# Patient Record
Sex: Male | Born: 1935 | Race: White | Hispanic: No | Marital: Married
Health system: Southern US, Community
[De-identification: ages and names within clinical notes are randomized; demographics above are authoritative.]

---

## 2002-07-08 ENCOUNTER — Emergency Department (HOSPITAL_COMMUNITY): Admission: EM | Admit: 2002-07-08 | Discharge: 2002-07-08 | Payer: Self-pay | Admitting: Emergency Medicine

## 2002-07-08 ENCOUNTER — Encounter: Payer: Self-pay | Admitting: Emergency Medicine

## 2004-12-05 ENCOUNTER — Ambulatory Visit: Payer: Self-pay | Admitting: Family Medicine

## 2005-12-29 ENCOUNTER — Other Ambulatory Visit: Payer: Self-pay

## 2005-12-29 ENCOUNTER — Emergency Department: Payer: Self-pay | Admitting: Emergency Medicine

## 2006-01-02 ENCOUNTER — Ambulatory Visit: Payer: Self-pay | Admitting: Emergency Medicine

## 2006-03-19 ENCOUNTER — Emergency Department: Payer: Self-pay | Admitting: Unknown Physician Specialty

## 2006-03-23 ENCOUNTER — Other Ambulatory Visit: Payer: Self-pay

## 2006-03-23 ENCOUNTER — Emergency Department: Payer: Self-pay | Admitting: Emergency Medicine

## 2006-03-25 ENCOUNTER — Other Ambulatory Visit: Payer: Self-pay

## 2006-03-25 ENCOUNTER — Inpatient Hospital Stay: Payer: Self-pay | Admitting: Internal Medicine

## 2006-03-26 ENCOUNTER — Other Ambulatory Visit: Payer: Self-pay

## 2006-04-23 ENCOUNTER — Emergency Department: Payer: Self-pay | Admitting: Unknown Physician Specialty

## 2006-10-08 ENCOUNTER — Ambulatory Visit: Payer: Self-pay | Admitting: Gastroenterology

## 2008-09-17 ENCOUNTER — Inpatient Hospital Stay: Payer: Self-pay | Admitting: Internal Medicine

## 2009-09-11 ENCOUNTER — Ambulatory Visit: Payer: Self-pay | Admitting: Ophthalmology

## 2009-09-17 ENCOUNTER — Ambulatory Visit: Payer: Self-pay | Admitting: Ophthalmology

## 2010-05-13 ENCOUNTER — Ambulatory Visit: Payer: Self-pay | Admitting: Ophthalmology

## 2010-05-20 ENCOUNTER — Ambulatory Visit: Payer: Self-pay | Admitting: Ophthalmology

## 2010-07-09 ENCOUNTER — Emergency Department: Payer: Self-pay | Admitting: Emergency Medicine

## 2010-07-22 ENCOUNTER — Encounter: Payer: Self-pay | Admitting: Internal Medicine

## 2010-08-15 NOTE — Letter (Signed)
Summary: [facility] Face Sheet  [facility] Face Sheet   Imported By: Beau Fanny 07/24/2010 09:39:36  _____________________________________________________________________  External Attachment:    Type:   Image     Comment:   External Document

## 2011-02-18 DIAGNOSIS — G20 Parkinson's disease: Secondary | ICD-10-CM

## 2011-02-18 DIAGNOSIS — E785 Hyperlipidemia, unspecified: Secondary | ICD-10-CM

## 2011-02-18 DIAGNOSIS — F068 Other specified mental disorders due to known physiological condition: Secondary | ICD-10-CM

## 2011-02-18 DIAGNOSIS — I951 Orthostatic hypotension: Secondary | ICD-10-CM

## 2011-03-20 DIAGNOSIS — F068 Other specified mental disorders due to known physiological condition: Secondary | ICD-10-CM

## 2011-03-20 DIAGNOSIS — I2510 Atherosclerotic heart disease of native coronary artery without angina pectoris: Secondary | ICD-10-CM

## 2011-03-20 DIAGNOSIS — G20 Parkinson's disease: Secondary | ICD-10-CM

## 2011-04-23 ENCOUNTER — Ambulatory Visit: Payer: Self-pay | Admitting: Internal Medicine

## 2011-06-19 ENCOUNTER — Ambulatory Visit: Payer: Self-pay | Admitting: Surgery

## 2011-06-26 ENCOUNTER — Ambulatory Visit: Payer: Self-pay | Admitting: Surgery

## 2011-11-07 ENCOUNTER — Emergency Department: Payer: Self-pay | Admitting: Emergency Medicine

## 2011-11-07 LAB — COMPREHENSIVE METABOLIC PANEL
Albumin: 3.3 g/dL — ABNORMAL LOW (ref 3.4–5.0)
Alkaline Phosphatase: 57 U/L (ref 50–136)
Anion Gap: 6 — ABNORMAL LOW (ref 7–16)
BUN: 18 mg/dL (ref 7–18)
Bilirubin,Total: 0.7 mg/dL (ref 0.2–1.0)
Calcium, Total: 8.8 mg/dL (ref 8.5–10.1)
Chloride: 104 mmol/L (ref 98–107)
Co2: 31 mmol/L (ref 21–32)
Creatinine: 1.04 mg/dL (ref 0.60–1.30)
EGFR (African American): >60
EGFR (Non-African Amer.): >60
Glucose: 69 mg/dL (ref 65–99)
Osmolality: 282 (ref 275–301)
Potassium: 4.7 mmol/L (ref 3.5–5.1)
SGOT(AST): 21 U/L (ref 15–37)
SGPT (ALT): 7 U/L — ABNORMAL LOW
Sodium: 141 mmol/L (ref 136–145)
Total Protein: 7 g/dL (ref 6.4–8.2)

## 2011-11-07 LAB — CBC
HCT: 37.8 % — ABNORMAL LOW (ref 40.0–52.0)
HGB: 12.5 g/dL — ABNORMAL LOW (ref 13.0–18.0)
MCH: 29.2 pg (ref 26.0–34.0)
MCHC: 33 g/dL (ref 32.0–36.0)
MCV: 88 fL (ref 80–100)
Platelet: 201 10*3/uL (ref 150–440)
RBC: 4.27 10*6/uL — ABNORMAL LOW (ref 4.40–5.90)
RDW: 13.9 % (ref 11.5–14.5)
WBC: 7.8 10*3/uL (ref 3.8–10.6)

## 2011-11-07 LAB — TROPONIN I: Troponin-I: <0.02 ng/mL

## 2011-11-11 DIAGNOSIS — T148XXA Other injury of unspecified body region, initial encounter: Secondary | ICD-10-CM

## 2011-11-11 DIAGNOSIS — F068 Other specified mental disorders due to known physiological condition: Secondary | ICD-10-CM

## 2011-11-11 DIAGNOSIS — F329 Major depressive disorder, single episode, unspecified: Secondary | ICD-10-CM

## 2011-11-11 DIAGNOSIS — G20 Parkinson's disease: Secondary | ICD-10-CM

## 2011-11-17 ENCOUNTER — Telehealth: Payer: Self-pay | Admitting: Internal Medicine

## 2011-11-17 NOTE — Telephone Encounter (Signed)
To: [employer] (After Hours Triage) Fax: [phone] From: [facility] Date/ Time: 2011-12-10 7:48 PM Taken By: Forbes Cellar, CSR Caller: Tammy [facility]: [facility] Patient: Roberto Washington, Roberto Washington DOB: [birth year] Phone: [phone] Reason for Call: Babette Relic is calling from [facility] regarding the death of Josian Lanese. Patient of Karie Schwalbe.. The patient expired on 12-10-2011 at 19:15. Regarding Appointment: Appt Date: Appt Time: Unknown Provider: Reason: Details: Outcome:

## 2011-11-17 NOTE — Telephone Encounter (Signed)
Reviewed with wife today

## 2011-12-13 DEATH — deceased

## 2012-06-23 IMAGING — CT CT HEAD WITHOUT CONTRAST
3 series · 18 of 30 positions shown, 20 images · non-contrast
Comparison: none

REASON FOR EXAM: trauma
COMMENTS:

[Series 2: soft tissue · axial · 0.45mm/px · z∈[-224,-110]mm · 8 of 31 slices shown, 10 images]
[im 4/31  brain]
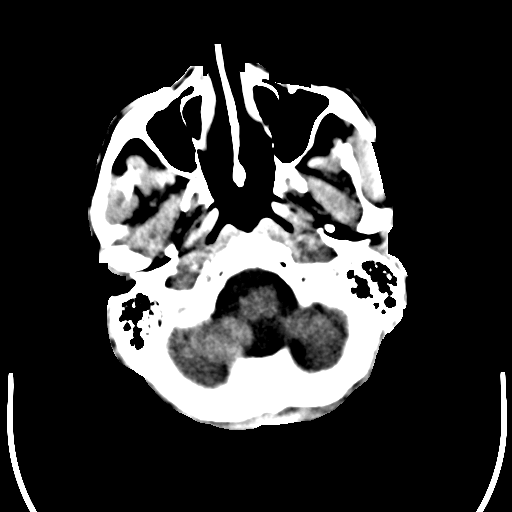
[im 4/31  bone]
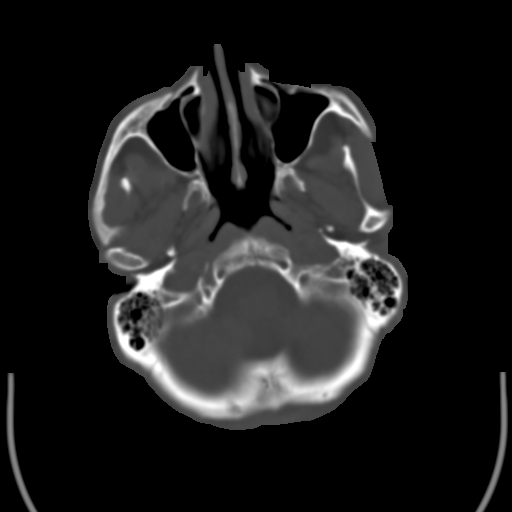
[im 7/31  brain]
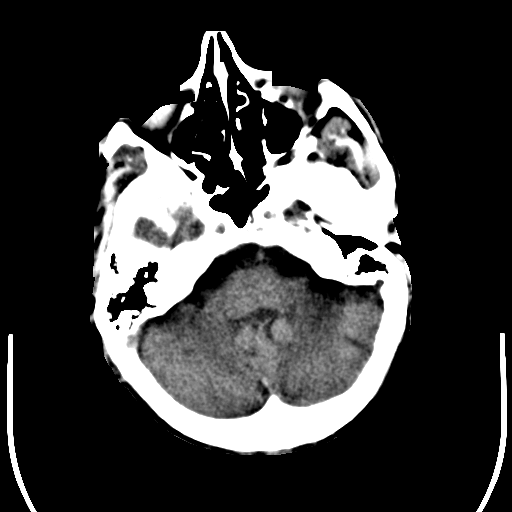
[im 11/31  brain]
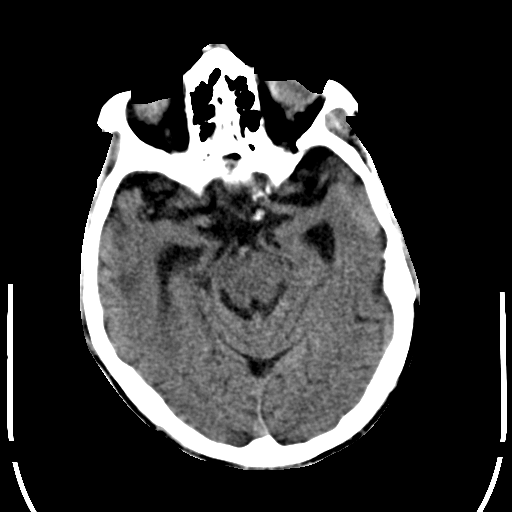
[im 14/31  brain]
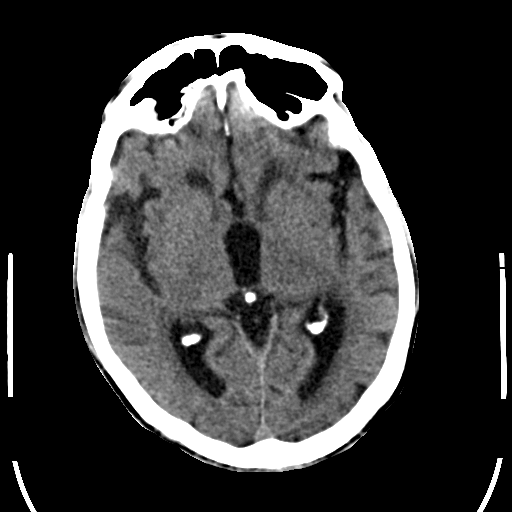
[im 17/31  brain]
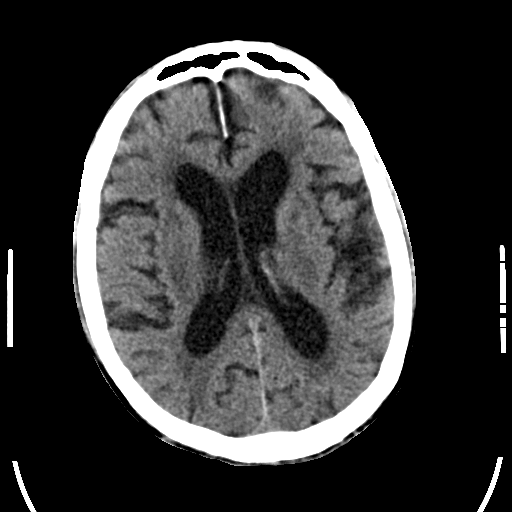
[im 17/31  bone]
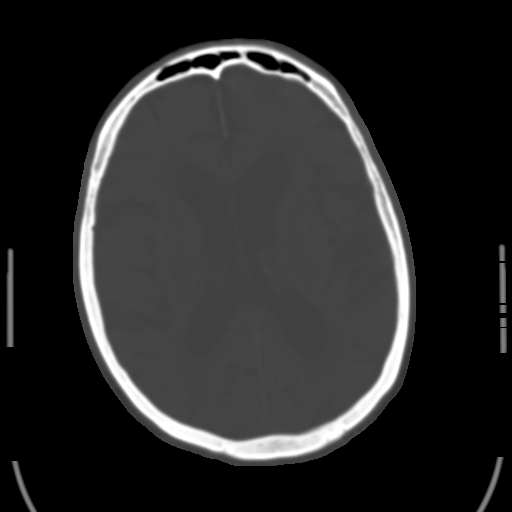
[im 21/31  brain]
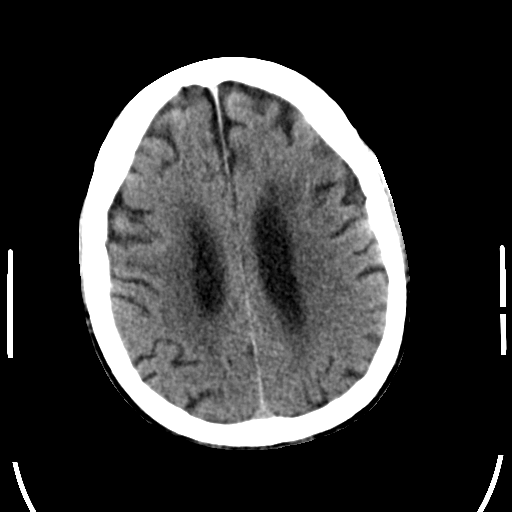
[im 24/31  brain]
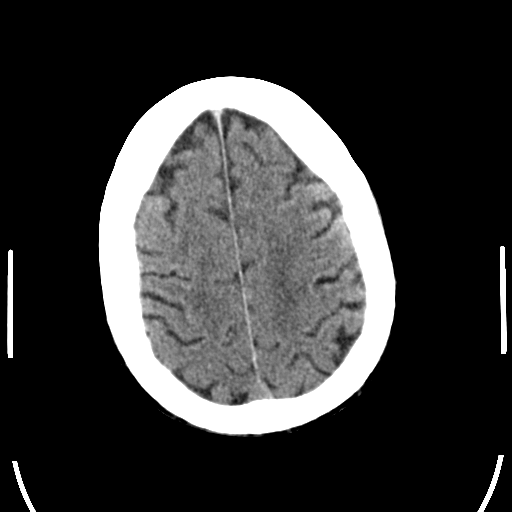
[im 27/31  brain]
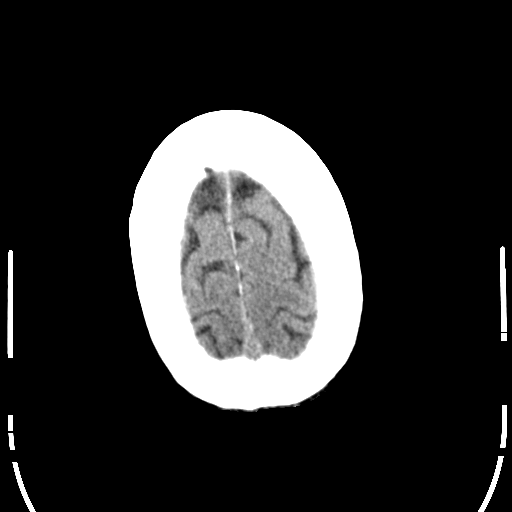

[Series 4: soft (id) · axial · 0.45mm/px · z∈[-191,-76]mm · 8 of 32 slices shown]
[im 4/32  brain]
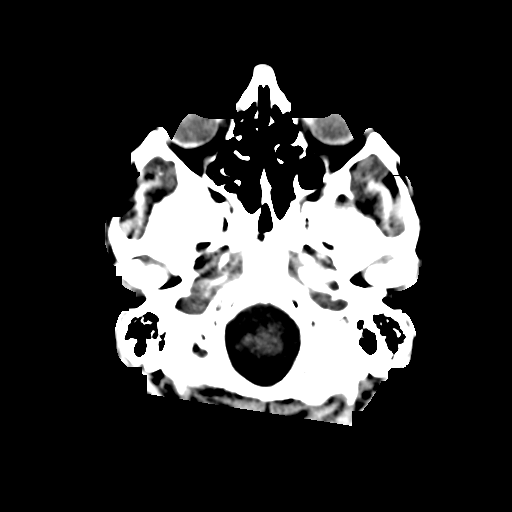
[im 7/32  brain]
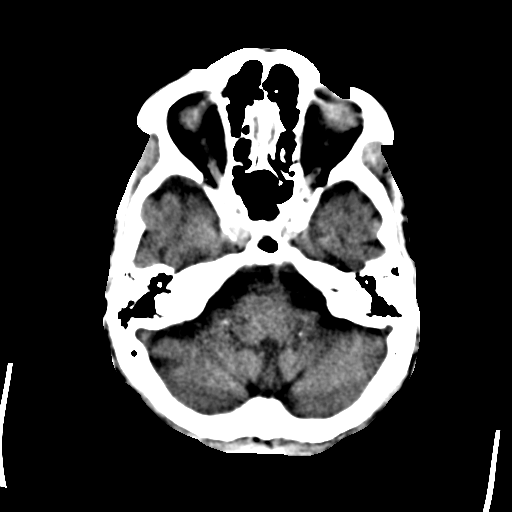
[im 11/32  brain]
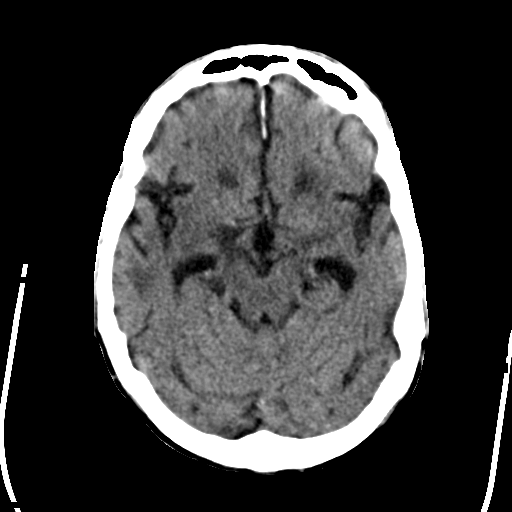
[im 14/32  brain]
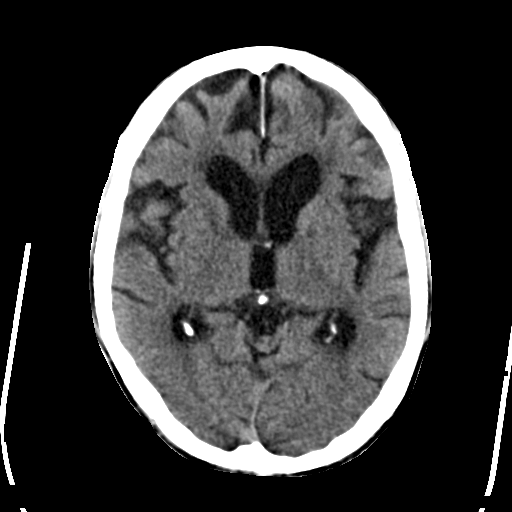
[im 18/32  brain]
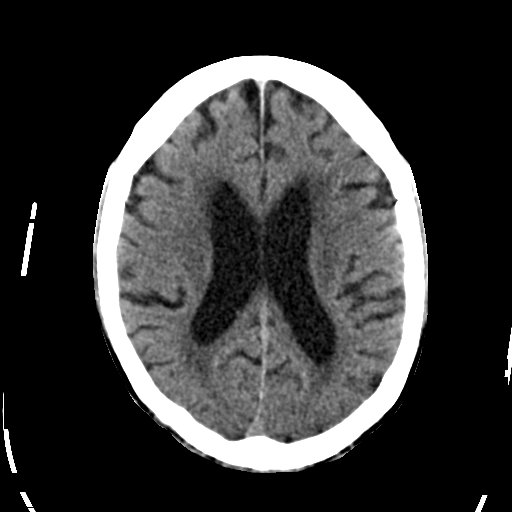
[im 21/32  brain]
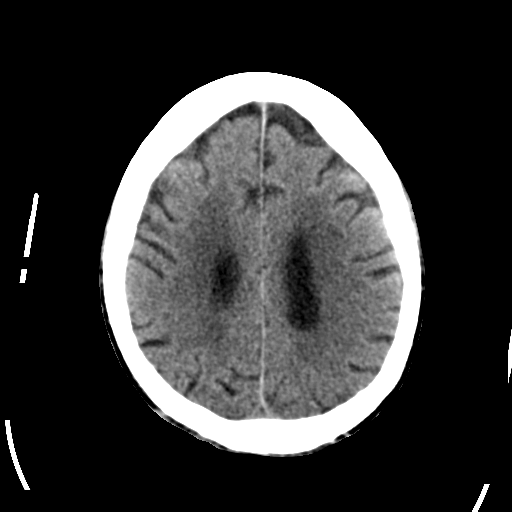
[im 25/32  brain]
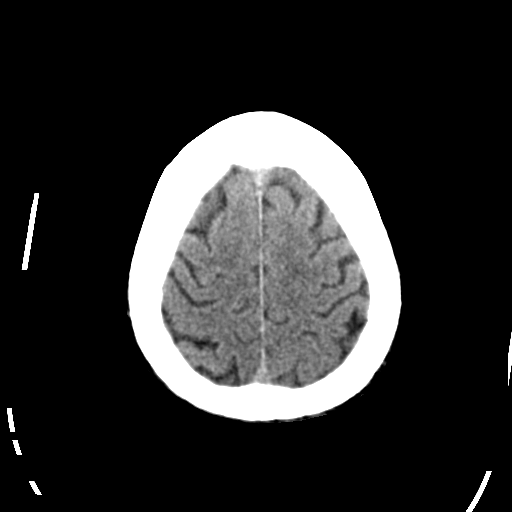
[im 28/32  brain]
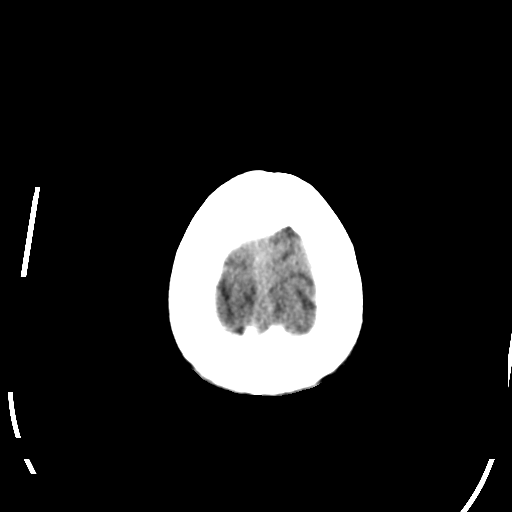

[Series 5: (id) · axial · 0.45mm/px · z∈[-197,-183]mm · 2 of 32 slices shown]
[im 4/32  brain]
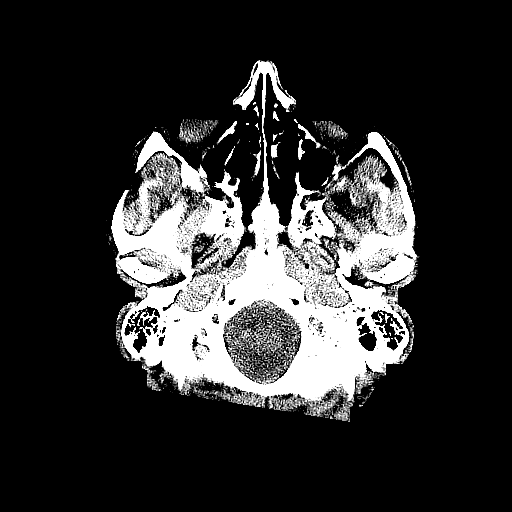
[im 7/32  brain]
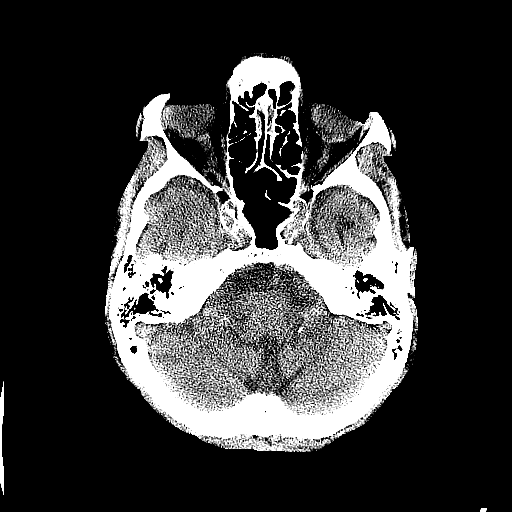

[18 of 30 positions shown; findings below may reference images not displayed]

PROCEDURE:     CT  - CT HEAD WITHOUT CONTRAST  - November 07, 2011 [DATE]

RESULT:     Noncontrast CT of the brain is performed. There is no previous
exam for comparison.

There is prominence of the ventricles and sulci consistent with atrophy.
Diffuse low-attenuation is present in the periventricular and subcortical
white matter consistent with chronic small vessel ischemic change. There is
no intracranial hemorrhage, mass or mass effect. The sinuses and mastoid air
cells show normal aeration. No depressed skull fracture is evident.
IMPRESSION: 1. Atrophy with chronic small vessel ischemic change. No acute intracranial
abnormality evident.

[REDACTED]

## 2014-11-05 NOTE — Op Note (Signed)
PATIENT NAME:  Roberto Washington, Roberto Washington MR#:  [id number] DATE OF BIRTH:  1921-03-20  DATE OF PROCEDURE:  06/26/2011  PREOPERATIVE DIAGNOSIS: Right inguinal hernia.   POSTOPERATIVE DIAGNOSIS: Right inguinal hernia.   PROCEDURE: Right inguinal hernia repair.   SURGEON: Adella Hare, MD   ASSISTANT: Carmell Austria, PA   ANESTHESIA: General.   INDICATIONS: This 81 year old male has a history of bulging in the right groin with associated pain. A right inguinal hernia was demonstrated on physical exam. He had a smaller hernia on the left side which was asymptomatic. We elected to fix the right inguinal hernia due to his pain.  DESCRIPTION OF PROCEDURE: He was placed on the operating table in the supine position under general anesthesia. The right lower quadrant of the abdomen was clipped and prepared with ChloraPrep and draped in a sterile manner.   A right lower quadrant transversely oriented suprapubic incision was made, carried down through subcutaneous tissues. One traversing vein was divided between 4-0 Vicryl ligatures. Scarpa's fascia was incised. The external oblique aponeurosis was incised along the course of its fibers to open the external ring and expose the inguinal cord structures. The cord structures were mobilized. Cremasteric fibers were spread to expose an indirect hernia sac. The sac was dissected free from surrounding structures up well into the internal ring. The internal ring was significantly enlarged. The sac was opened and demonstrating sliding hernia containing cecum. A portion of the sac was excised and then the peritoneum was repaired with a running 3-0 chromic locked stitch and the bowel with the remaining sac was returned to the peritoneal cavity. Next, there was a cord lipoma which was about 4 to 5 cm in dimension and was dissected free from surrounding structures and high ligation was done with a 4-0 Vicryl ligature and the lipoma was excised. Next, there was a finding of a  direct inguinal hernia. This sac was approximately 5 cm in length. The attenuated transversalis fascia was incised circumferentially and removed. Next, the sac was opened. Its continuity with the peritoneal cavity was demonstrated and high ligation of this sac was done with a 0 Surgilon suture ligature and the sac was excised, did not appear to need any pathological evaluations. Next, the repair was carried out by closing the transversalis fascia with a running 0 Surgilon suture. Next, the repair was further carried out with a row of sutures beginning at the pubic tubercle and carried up to the internal ring suturing the conjoined tendon to the Cooper's ligament with transition stitch onto the shelving edge of the inguinal ligament leading up to satisfactory narrowing of the internal ring with the last stitch. There was some degree of tension and a relaxing incision was made medially. This released the tension. Next, an onlay Atrium mesh was cut to create an oval shape of some 2.5 x 4 cm in dimension, was placed into the wound at the floor of the inguinal canal directly overlying the repair. There was a notch cut out to straddle the internal ring. The mesh was sewn into place with interrupted 0 Surgilon sutures and also the medial portion extended beyond the relaxing incision to the fascia on the medial aspect of the relaxing incision. The repair looked good. A number of small bleeding points were cauterized during the course of the procedure. Hemostasis subsequently appeared to be intact. Next, the deep fascia surrounding the incision was infiltrated with 0.5% Sensorcaine with epinephrine. The cut edges of the external oblique aponeurosis were approximated with running  4-0 Vicryl to recreate the external ring. Additional deep fascia superior and lateral to the repair site was further infiltrated with Sensorcaine with epinephrine and also subcutaneous tissues were infiltrated as well. The Scarpa's fascia was closed  with interrupted 4-0 Vicryl. The skin was closed with a running 4-0 Monocryl subcuticular suture and Dermabond.   The patient tolerated the procedure satisfactorily and is now being prepared for transfer to the recovery room.   ____________________________ Shela CommonsJ. Renda RollsWilton Kiah Keay, MD jws:drc D: 06/26/2011 09:27:55 ET T: 06/26/2011 10:08:04 ET JOB#: [id number]  cc: Adella HareJ. Roberto Roberto Chopra, MD, <Dictator> Adella HareWILTON J Davontae Prusinski MD ELECTRONICALLY SIGNED 07/19/2011 17:33
